# Patient Record
Sex: Female | Born: 1960 | Race: Black or African American | Hispanic: No | Marital: Single | Smoking: Never smoker
Health system: Southern US, Community
[De-identification: ages and names within clinical notes are randomized; demographics above are authoritative.]

## PROBLEM LIST (undated history)

## (undated) DIAGNOSIS — E119 Type 2 diabetes mellitus without complications: Secondary | ICD-10-CM

## (undated) DIAGNOSIS — J449 Chronic obstructive pulmonary disease, unspecified: Secondary | ICD-10-CM

## (undated) HISTORY — PX: BACK SURGERY: SHX140

---

## 2016-01-29 ENCOUNTER — Encounter (HOSPITAL_COMMUNITY): Payer: Self-pay | Admitting: *Deleted

## 2016-01-29 ENCOUNTER — Emergency Department (HOSPITAL_COMMUNITY): Payer: No Typology Code available for payment source

## 2016-01-29 ENCOUNTER — Emergency Department (HOSPITAL_COMMUNITY)
Admission: EM | Admit: 2016-01-29 | Discharge: 2016-01-29 | Disposition: A | Discharge status: Home / Self Care | Payer: No Typology Code available for payment source | Attending: Emergency Medicine | Admitting: Emergency Medicine

## 2016-01-29 DIAGNOSIS — Y9389 Activity, other specified: Secondary | ICD-10-CM | POA: Insufficient documentation

## 2016-01-29 DIAGNOSIS — S3992XA Unspecified injury of lower back, initial encounter: Secondary | ICD-10-CM | POA: Diagnosis present

## 2016-01-29 DIAGNOSIS — S39012A Strain of muscle, fascia and tendon of lower back, initial encounter: Secondary | ICD-10-CM | POA: Diagnosis not present

## 2016-01-29 DIAGNOSIS — M5430 Sciatica, unspecified side: Secondary | ICD-10-CM | POA: Diagnosis not present

## 2016-01-29 DIAGNOSIS — J449 Chronic obstructive pulmonary disease, unspecified: Secondary | ICD-10-CM | POA: Insufficient documentation

## 2016-01-29 DIAGNOSIS — Y998 Other external cause status: Secondary | ICD-10-CM | POA: Insufficient documentation

## 2016-01-29 DIAGNOSIS — Y92410 Unspecified street and highway as the place of occurrence of the external cause: Secondary | ICD-10-CM | POA: Diagnosis not present

## 2016-01-29 DIAGNOSIS — E119 Type 2 diabetes mellitus without complications: Secondary | ICD-10-CM | POA: Insufficient documentation

## 2016-01-29 DIAGNOSIS — Z880 Allergy status to penicillin: Secondary | ICD-10-CM | POA: Diagnosis not present

## 2016-01-29 HISTORY — DX: Type 2 diabetes mellitus without complications: E11.9

## 2016-01-29 HISTORY — DX: Chronic obstructive pulmonary disease, unspecified: J44.9

## 2016-01-29 MED ORDER — METHOCARBAMOL 500 MG PO TABS: 500.0000 mg | ORAL_TABLET | Freq: Two times a day (BID) | ORAL | Status: DC

## 2016-01-29 MED ORDER — KETOROLAC TROMETHAMINE 30 MG/ML IJ SOLN
30.0000 mg | Freq: Once | INTRAMUSCULAR | Status: AC
  Administered 2016-01-29: 30 mg via INTRAMUSCULAR
  Filled 2016-01-29: qty 1

## 2016-01-29 MED ORDER — HYDROCODONE-ACETAMINOPHEN 5-325 MG PO TABS
1.0000 | ORAL_TABLET | Freq: Once | ORAL | Status: AC
  Administered 2016-01-29: 1 via ORAL
  Filled 2016-01-29: qty 1

## 2016-01-29 MED ORDER — HYDROCODONE-ACETAMINOPHEN 5-325 MG PO TABS: 1.0000 | ORAL_TABLET | Freq: Four times a day (QID) | ORAL | Status: AC | PRN

## 2016-01-29 MED ORDER — NAPROXEN 500 MG PO TABS: 500.0000 mg | ORAL_TABLET | Freq: Two times a day (BID) | ORAL | Status: DC
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
## 2016-01-29 NOTE — ED Notes (Signed)
Patient transported to X-ray 
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
## 2018-07-21 ENCOUNTER — Other Ambulatory Visit: Payer: Self-pay

## 2018-07-21 ENCOUNTER — Emergency Department (HOSPITAL_COMMUNITY): Payer: BLUE CROSS/BLUE SHIELD

## 2018-07-21 ENCOUNTER — Encounter (HOSPITAL_COMMUNITY): Payer: Self-pay | Admitting: Emergency Medicine

## 2018-07-21 ENCOUNTER — Emergency Department (HOSPITAL_COMMUNITY)
Admission: EM | Admit: 2018-07-21 | Discharge: 2018-07-22 | Disposition: A | Discharge status: Home / Self Care | Payer: BLUE CROSS/BLUE SHIELD | Attending: Emergency Medicine | Admitting: Emergency Medicine

## 2018-07-21 DIAGNOSIS — Z7984 Long term (current) use of oral hypoglycemic drugs: Secondary | ICD-10-CM | POA: Diagnosis not present

## 2018-07-21 DIAGNOSIS — E119 Type 2 diabetes mellitus without complications: Secondary | ICD-10-CM | POA: Insufficient documentation

## 2018-07-21 DIAGNOSIS — J0140 Acute pansinusitis, unspecified: Secondary | ICD-10-CM | POA: Diagnosis not present

## 2018-07-21 DIAGNOSIS — J449 Chronic obstructive pulmonary disease, unspecified: Secondary | ICD-10-CM | POA: Insufficient documentation

## 2018-07-21 DIAGNOSIS — R51 Headache: Secondary | ICD-10-CM | POA: Diagnosis present

## 2018-07-21 DIAGNOSIS — R062 Wheezing: Secondary | ICD-10-CM

## 2018-07-21 DIAGNOSIS — Z79899 Other long term (current) drug therapy: Secondary | ICD-10-CM | POA: Insufficient documentation

## 2018-07-21 LAB — URINALYSIS, ROUTINE W REFLEX MICROSCOPIC
Bacteria, UA: NONE SEEN
Bilirubin Urine: NEGATIVE
GLUCOSE, UA: NEGATIVE mg/dL
Hgb urine dipstick: NEGATIVE
Ketones, ur: NEGATIVE mg/dL
Nitrite: NEGATIVE
Protein, ur: NEGATIVE mg/dL
Specific Gravity, Urine: 1.021 (ref 1.005–1.030)
pH: 6 (ref 5.0–8.0)

## 2018-07-21 LAB — CBC
HCT: 41.2 % (ref 36.0–46.0)
Hemoglobin: 13.6 g/dL (ref 12.0–15.0)
MCH: 28.9 pg (ref 26.0–34.0)
MCHC: 33 g/dL (ref 30.0–36.0)
MCV: 87.5 fL (ref 78.0–100.0)
PLATELETS: 277 10*3/uL (ref 150–400)
RBC: 4.71 MIL/uL (ref 3.87–5.11)
RDW: 13.5 % (ref 11.5–15.5)
WBC: 8.2 10*3/uL (ref 4.0–10.5)

## 2018-07-21 LAB — BASIC METABOLIC PANEL
Anion gap: 10 (ref 5–15)
BUN: 22 mg/dL — ABNORMAL HIGH (ref 6–20)
CALCIUM: 9.1 mg/dL (ref 8.9–10.3)
CO2: 25 mmol/L (ref 22–32)
CREATININE: 0.97 mg/dL (ref 0.44–1.00)
Chloride: 103 mmol/L (ref 98–111)
GFR calc Af Amer: >60 mL/min (ref >60)
GFR calc non Af Amer: >60 mL/min (ref >60)
GLUCOSE: 96 mg/dL (ref 70–99)
Potassium: 4.1 mmol/L (ref 3.5–5.1)
Sodium: 138 mmol/L (ref 135–145)

## 2018-07-21 LAB — CBG MONITORING, ED: Glucose-Capillary: 88 mg/dL (ref 70–99)

## 2018-07-21 MED ORDER — IPRATROPIUM-ALBUTEROL 0.5-2.5 (3) MG/3ML IN SOLN
3.0000 mL | Freq: Once | RESPIRATORY_TRACT | Status: AC
Start: 2018-07-21 — End: 2018-07-21
  Administered 2018-07-21: 3 mL via RESPIRATORY_TRACT
  Filled 2018-07-21: qty 3

## 2018-07-21 MED ORDER — IPRATROPIUM-ALBUTEROL 0.5-2.5 (3) MG/3ML IN SOLN
3.0000 mL | Freq: Once | RESPIRATORY_TRACT | Status: AC
  Administered 2018-07-21: 3 mL via RESPIRATORY_TRACT
  Filled 2018-07-21: qty 3

## 2018-07-21 MED ORDER — DEXAMETHASONE SODIUM PHOSPHATE 10 MG/ML IJ SOLN
10.0000 mg | Freq: Once | INTRAMUSCULAR | Status: AC
  Administered 2018-07-21: 10 mg via INTRAMUSCULAR
  Filled 2018-07-21: qty 1

## 2018-07-21 NOTE — ED Triage Notes (Signed)
Patient is complaining of congested nose for two days. Patient states that it is making her weak.

## 2018-07-21 NOTE — ED Provider Notes (Signed)
[facility]-EMERGENCY DEPT Provider Note   CSN: [id number] Arrival date & time: 07/21/18  2008     History   Chief Complaint Chief Complaint  Patient presents with  . Weakness  . Nasal Congestion    HPI Diana Bender is a 57 y.o. female.  Patient presents to the emergency department with a chief complaint of headache, blurred vision, sinus congestion, and shortness of breath.  She reports history of asthma, and states that she has been wheezing.  She states that all of her symptoms started about 3 or 4 days ago.  She states that she is scheduled to see an ENT regarding recurrent sinus infections, but states that she has been told she would need a CT prior to doing so.  She is concerned about her blurred vision.  The history is provided by the patient. No language interpreter was used.    Past Medical History:  Diagnosis Date  . COPD (chronic obstructive pulmonary disease) (HCC)   . Diabetes mellitus without complication (HCC)     There are no active problems to display for this patient.   Past Surgical History:  Procedure Laterality Date  . BACK SURGERY       OB History   None      Home Medications    Prior to Admission medications   Medication Sig Start Date End Date Taking? Authorizing Provider  baclofen (LIORESAL) 10 MG tablet Take 10 mg by mouth 2 (two) times daily. 05/16/18  Yes [provider]  BREO ELLIPTA 100-25 MCG/INH AEPB Inhale 1 puff into the lungs daily. 06/23/18  Yes [provider]  HYDROcodone-acetaminophen (NORCO/VICODIN) 5-325 MG tablet Take 1 tablet by mouth every 6 (six) hours as needed. 01/29/16  Yes Sam, Serena Y, PA-C  lisinopril (PRINIVIL,ZESTRIL) 10 MG tablet Take 10 mg by mouth daily.   Yes [provider]  LYRICA 50 MG capsule Take 50 mg by mouth at bedtime. 06/29/18  Yes [provider]  metFORMIN (GLUCOPHAGE) 1000 MG tablet Take 1,000 mg by mouth 2 (two) times daily. 05/31/18   Yes [provider]  montelukast (SINGULAIR) 10 MG tablet Take 10 mg by mouth at bedtime.   Yes [provider]  albuterol (PROVENTIL HFA;VENTOLIN HFA) 108 (90 Base) MCG/ACT inhaler Inhale 2 puffs into the lungs every 6 (six) hours as needed for wheezing.    [provider]    Family History History reviewed. No pertinent family history.  Social History Social History   Tobacco Use  . Smoking status: Never Smoker  . Smokeless tobacco: Never Used  Substance Use Topics  . Alcohol use: No  . Drug use: Never     Allergies   Penicillins   Review of Systems Review of Systems  Neurological: Positive for weakness.  All other systems reviewed and are negative.    Physical Exam Updated Vital Signs BP (!) 135/94 (BP Location: Left Arm)   Pulse 90   Temp 98.2 F (36.8 C) (Oral)   Resp 20   Ht 5' 5.5" (1.664 m)   Wt 101.6 kg   SpO2 100%   BMI 36.71 kg/m   Physical Exam  Constitutional: She is oriented to person, place, and time. She appears well-developed and well-nourished.  HENT:  Head: Normocephalic and atraumatic.  Eyes: Pupils are equal, round, and reactive to light. Conjunctivae and EOM are normal.  Neck: Normal range of motion. Neck supple.  Cardiovascular: Normal rate and regular rhythm. Exam reveals no gallop and no  friction rub.  No murmur heard. Pulmonary/Chest: No respiratory distress. She has wheezes. She has no rales. She exhibits no tenderness.  Diffuse wheezes bilaterally  Abdominal: Soft. Bowel sounds are normal. She exhibits no distension and no mass. There is no tenderness. There is no rebound and no guarding.  Musculoskeletal: Normal range of motion. She exhibits no edema or tenderness.  Neurological: She is alert and oriented to person, place, and time.  GCS 15, CN III-XII intact, speech is clear, movements are goal oriented, sensation and strength 5/5 throughout  Skin: Skin is warm and dry.  Psychiatric: She has a normal  mood and affect. Her behavior is normal. Judgment and thought content normal.  Nursing note and vitals reviewed.    ED Treatments / Results  Labs (all labs ordered are listed, but only abnormal results are displayed) Labs Reviewed  BASIC METABOLIC PANEL - Abnormal; Notable for the following components:      Result Value   BUN 22 (*)    All other components within normal limits  URINALYSIS, ROUTINE W REFLEX MICROSCOPIC - Abnormal; Notable for the following components:   Leukocytes, UA SMALL (*)    All other components within normal limits  CBC  CBG MONITORING, ED    EKG None  Radiology Dg Chest 2 View  Result Date: 07/21/2018 CLINICAL DATA:  Nasal congestion for 2 days. Weakness. History of COPD and diabetes. EXAM: CHEST - 2 VIEW COMPARISON:  None. FINDINGS: Slightly shallow inspiration. The heart size and mediastinal contours are within normal limits. Both lungs are clear. The visualized skeletal structures are unremarkable. IMPRESSION: No active cardiopulmonary disease. Electronically Signed   By: Burman NievesWilliam  Stevens M.D.   On: 07/21/2018 22:46   Ct Head Wo Contrast  Result Date: 07/21/2018 CLINICAL DATA:  Headache nasal congestion EXAM: CT HEAD WITHOUT CONTRAST TECHNIQUE: Contiguous axial images were obtained from the base of the skull through the vertex without intravenous contrast. COMPARISON:  None. FINDINGS: Brain: No acute territorial infarction, hemorrhage or intracranial mass. Normal ventricle size Vascular: No hyperdense vessel or unexpected calcification. Skull: Normal. Negative for fracture or focal lesion. Sinuses/Orbits: Fluid levels in the maxillary sinuses. Extensive opacification of the sphenoid, frontal, and ethmoid sinuses. Soft tissue thickening within the nasal passages. Debris in the posterior nasopharynx. Other: None IMPRESSION: 1. Negative non contrasted CT appearance of the brain 2. Soft tissue or debris within the nasal passages. Pan sinusitis with fluid levels in  both maxillary sinuses. Electronically Signed   By: Jasmine PangKim  Fujinaga M.D.   On: 07/21/2018 22:57    Procedures Procedures (including critical care time)  Medications Ordered in ED Medications  albuterol (PROVENTIL HFA;VENTOLIN HFA) 108 (90 Base) MCG/ACT inhaler 2 puff (has no administration in time range)  ipratropium-albuterol (DUONEB) 0.5-2.5 (3) MG/3ML nebulizer solution 3 mL (3 mLs Nebulization Given 07/21/18 2256)  dexamethasone (DECADRON) injection 10 mg (10 mg Intramuscular Given 07/21/18 2347)  ipratropium-albuterol (DUONEB) 0.5-2.5 (3) MG/3ML nebulizer solution 3 mL (3 mLs Nebulization Given 07/21/18 2350)     Initial Impression / Assessment and Plan / ED Course  I have reviewed the triage vital signs and the nursing notes.  Pertinent labs & imaging results that were available during my care of the patient were reviewed by me and considered in my medical decision making (see chart for details).     Patient with sinus pressure, congestion, and wheezing.  We will give breathing treatment for her wheezing, will reassess.  She does have significant sinus tenderness and congestion.  She  states that she needs to see an ENT, but needs to have a CT prior to doing so.  She questions whether she can get this here tonight.  She does state that she has had some blurred vision, but this has currently resolved.  Given this finding, feel that CT is appropriate.  CT shows pansinusitis, no evidence of abscess or other intra-cranial process.  Will treat with moxifloxacin, as the patient has been on antibiotics recently.  Patient reassessed, she is still having some wheezing, will give a shot of Decadron and an additional breathing treatment.  12:47 AM Patient states that she now feels significantly improved.  Her lungs are greatly improved on auscultation, will send home with inhaler.  Will discharge at this time.  PCP/ENT follow-up.  Final Clinical Impressions(s) / ED Diagnoses   Final diagnoses:    Acute pansinusitis, recurrence not specified  Wheezing    ED Discharge Orders         Ordered    moxifloxacin (AVELOX) 400 MG tablet  Daily     07/22/18 0045           Roxy HorsemanBrowning, Braxten Memmer, PA-C 07/22/18 0048    Sabas SousBero, Michael M, MD 07/22/18 [phone]

## 2018-07-21 NOTE — ED Notes (Signed)
Patient transported to X-ray 

## 2018-07-22 MED ORDER — ALBUTEROL SULFATE HFA 108 (90 BASE) MCG/ACT IN AERS
2.0000 | INHALATION_SPRAY | RESPIRATORY_TRACT | Status: DC | PRN
  Administered 2018-07-22: 2 via RESPIRATORY_TRACT
  Filled 2018-07-22: qty 6.7

## 2018-07-22 MED ORDER — MOXIFLOXACIN HCL 400 MG PO TABS: 400.0000 mg | ORAL_TABLET | Freq: Every day | ORAL | 0 refills | Status: AC

## 2022-05-06 DEATH — deceased
